# Patient Record
Sex: Male | Born: 1970 | Race: White | Hispanic: Yes | Marital: Married | Smoking: Never smoker
Health system: Southern US, Community
[De-identification: ages and names within clinical notes are randomized; demographics above are authoritative.]

## PROBLEM LIST (undated history)

## (undated) DIAGNOSIS — E079 Disorder of thyroid, unspecified: Secondary | ICD-10-CM

## (undated) DIAGNOSIS — K219 Gastro-esophageal reflux disease without esophagitis: Secondary | ICD-10-CM

## (undated) DIAGNOSIS — E785 Hyperlipidemia, unspecified: Secondary | ICD-10-CM

## (undated) HISTORY — DX: Hyperlipidemia, unspecified: E78.5

## (undated) HISTORY — DX: Disorder of thyroid, unspecified: E07.9

## (undated) HISTORY — DX: Gastro-esophageal reflux disease without esophagitis: K21.9

---

## 2004-06-14 ENCOUNTER — Ambulatory Visit: Payer: Self-pay | Admitting: Internal Medicine

## 2004-06-21 ENCOUNTER — Ambulatory Visit: Payer: Self-pay | Admitting: Internal Medicine

## 2005-06-22 ENCOUNTER — Ambulatory Visit: Payer: Self-pay | Admitting: Internal Medicine

## 2005-06-28 ENCOUNTER — Ambulatory Visit: Payer: Self-pay | Admitting: Internal Medicine

## 2006-01-26 ENCOUNTER — Ambulatory Visit: Payer: Self-pay | Admitting: Internal Medicine

## 2006-02-28 ENCOUNTER — Ambulatory Visit: Payer: Self-pay | Admitting: Internal Medicine

## 2006-03-03 ENCOUNTER — Ambulatory Visit (HOSPITAL_COMMUNITY): Admission: RE | Admit: 2006-03-03 | Discharge: 2006-03-03 | Payer: Self-pay | Admitting: Internal Medicine

## 2007-05-02 ENCOUNTER — Encounter: Payer: Self-pay | Admitting: *Deleted

## 2007-05-02 DIAGNOSIS — M722 Plantar fascial fibromatosis: Secondary | ICD-10-CM

## 2016-10-19 ENCOUNTER — Other Ambulatory Visit: Payer: Self-pay | Admitting: Family Medicine

## 2016-10-19 ENCOUNTER — Ambulatory Visit
Admission: RE | Admit: 2016-10-19 | Discharge: 2016-10-19 | Disposition: A | Discharge status: Home / Self Care | Payer: BLUE CROSS/BLUE SHIELD | Source: Ambulatory Visit | Attending: Family Medicine | Admitting: Family Medicine

## 2016-10-19 DIAGNOSIS — R053 Chronic cough: Secondary | ICD-10-CM

## 2016-10-19 DIAGNOSIS — R05 Cough: Secondary | ICD-10-CM

## 2020-07-15 ENCOUNTER — Other Ambulatory Visit: Payer: Self-pay

## 2020-07-15 ENCOUNTER — Ambulatory Visit
Admission: RE | Admit: 2020-07-15 | Discharge: 2020-07-15 | Disposition: A | Discharge status: Home / Self Care | Payer: BLUE CROSS/BLUE SHIELD | Source: Ambulatory Visit | Attending: Family Medicine | Admitting: Family Medicine

## 2020-07-15 ENCOUNTER — Other Ambulatory Visit: Payer: Self-pay | Admitting: Family Medicine

## 2020-07-15 DIAGNOSIS — R52 Pain, unspecified: Secondary | ICD-10-CM

## 2021-05-21 ENCOUNTER — Encounter: Payer: Self-pay | Admitting: Gastroenterology

## 2021-07-07 ENCOUNTER — Ambulatory Visit (AMBULATORY_SURGERY_CENTER): Payer: BLUE CROSS/BLUE SHIELD | Admitting: *Deleted

## 2021-07-07 ENCOUNTER — Other Ambulatory Visit: Payer: Self-pay

## 2021-07-07 VITALS — Ht 70.0 in | Wt 170.0 lb

## 2021-07-07 DIAGNOSIS — Z1211 Encounter for screening for malignant neoplasm of colon: Secondary | ICD-10-CM

## 2021-07-07 MED ORDER — SUTAB 1479-225-188 MG PO TABS: 1.0000 | ORAL_TABLET | ORAL | 0 refills | Status: DC

## 2021-07-07 NOTE — Progress Notes (Signed)
Patient's pre-visit was done today over the phone with the patient and wife. Name,DOB and address verified. Patient denies any allergies to Eggs and Soy. Patient denies any problems with anesthesia/sedation. Patient is not taking any diet pills or blood thinners. No home Oxygen. Prep instructions sent to pt's email-pt is aware. Patient understands to call us back with any questions or concerns. Patient is aware of our care-partner policy and Covid-19 safety protocol.    The patient is COVID-19 vaccinated.

## 2021-07-08 ENCOUNTER — Encounter: Payer: Self-pay | Admitting: Gastroenterology

## 2021-07-14 ENCOUNTER — Ambulatory Visit (AMBULATORY_SURGERY_CENTER): Payer: BC Managed Care – PPO | Admitting: Gastroenterology

## 2021-07-14 ENCOUNTER — Other Ambulatory Visit: Payer: Self-pay

## 2021-07-14 ENCOUNTER — Encounter: Payer: Self-pay | Admitting: Gastroenterology

## 2021-07-14 VITALS — BP 103/55 | HR 69 | Temp 97.7°F | Resp 12 | Ht 70.0 in | Wt 170.0 lb

## 2021-07-14 DIAGNOSIS — Z1211 Encounter for screening for malignant neoplasm of colon: Secondary | ICD-10-CM | POA: Diagnosis not present

## 2021-07-14 MED ORDER — SODIUM CHLORIDE 0.9 % IV SOLN: 500.0000 mL | Freq: Once | INTRAVENOUS | Status: DC

## 2021-07-14 NOTE — Op Note (Signed)
[facility] Patient Name: Brandon Lynch Procedure Date: 07/14/2021 1:38 PM MRN: [id number] Endoscopist: Viviann Spare P. Adela Lank , MD Age: 50 Referring MD:  Date of Birth: 1970/11/28 Gender: Male Account #: [id number] Procedure:                Colonoscopy Indications:              Screening for colorectal malignant neoplasm, This                            is the patient's first colonoscopy Medicines:                Monitored Anesthesia Care Procedure:                Pre-Anesthesia Assessment:                           - Prior to the procedure, a History and Physical                            was performed, and patient medications and                            allergies were reviewed. The patient's tolerance of                            previous anesthesia was also reviewed. The risks                            and benefits of the procedure and the sedation                            options and risks were discussed with the patient.                            All questions were answered, and informed consent                            was obtained. Prior Anticoagulants: The patient has                            taken no previous anticoagulant or antiplatelet                            agents. ASA Grade Assessment: II - A patient with                            mild systemic disease. After reviewing the risks                            and benefits, the patient was deemed in                            satisfactory condition to undergo the procedure.  After obtaining informed consent, the colonoscope                            was passed under direct vision. Throughout the                            procedure, the patient's blood pressure, pulse, and                            oxygen saturations were monitored continuously. The                            Olympus Colonoscope #[id number] was introduced through                            the anus and  advanced to the the cecum, identified                            by appendiceal orifice and ileocecal valve. The                            colonoscopy was performed without difficulty. The                            patient tolerated the procedure well. The quality                            of the bowel preparation was good. The ileocecal                            valve, appendiceal orifice, and rectum were                            photographed. Scope In: 1:42:29 PM Scope Out: 2:02:16 PM Scope Withdrawal Time: 0 hours 17 minutes 10 seconds  Total Procedure Duration: 0 hours 19 minutes 47 seconds  Findings:                 The perianal and digital rectal examinations were                            normal.                           Internal hemorrhoids were found during                            retroflexion. The hemorrhoids were small.                           The exam was otherwise without abnormality. Complications:            No immediate complications. Estimated blood loss:                            None. Estimated Blood Loss:  Estimated blood loss: none. Impression:               - Internal hemorrhoids.                           - The examination was otherwise normal.                           - No polyps. Recommendation:           - Patient has a contact number available for                            emergencies. The signs and symptoms of potential                            delayed complications were discussed with the                            patient. Return to normal activities tomorrow.                            Written discharge instructions were provided to the                            patient.                           - Resume previous diet.                           - Continue present medications.                           - Repeat colonoscopy in 10 years for screening                            purposes. Viviann Spare P. Adela Lank, MD 07/14/2021 2:05:32  PM This report has been signed electronically.

## 2021-07-14 NOTE — Patient Instructions (Signed)
Handouts on hemorrhoids given. YOU HAD AN ENDOSCOPIC PROCEDURE TODAY AT THE [facility]:   Refer to the procedure report that was given to you for any specific questions about what was found during the examination.  If the procedure report does not answer your questions, please call your gastroenterologist to clarify.  If you requested that your care partner not be given the details of your procedure findings, then the procedure report has been included in a sealed envelope for you to review at your convenience later.  YOU SHOULD EXPECT: Some feelings of bloating in the abdomen. Passage of more gas than usual.  Walking can help get rid of the air that was put into your GI tract during the procedure and reduce the bloating. If you had a lower endoscopy (such as a colonoscopy or flexible sigmoidoscopy) you may notice spotting of blood in your stool or on the toilet paper. If you underwent a bowel prep for your procedure, you may not have a normal bowel movement for a few days.  Please Note:  You might notice some irritation and congestion in your nose or some drainage.  This is from the oxygen used during your procedure.  There is no need for concern and it should clear up in a day or so.  SYMPTOMS TO REPORT IMMEDIATELY:  Following lower endoscopy (colonoscopy or flexible sigmoidoscopy):  Excessive amounts of blood in the stool  Significant tenderness or worsening of abdominal pains  Swelling of the abdomen that is new, acute  Fever of 100F or higher   For urgent or emergent issues, a gastroenterologist can be reached at any hour by calling [phone]. Do not use MyChart messaging for urgent concerns.    DIET:  We do recommend a small meal at first, but then you may proceed to your regular diet.  Drink plenty of fluids but you should avoid alcoholic beverages for 24 hours.  ACTIVITY:  You should plan to take it easy for the rest of today and you should NOT DRIVE or use heavy  machinery until tomorrow (because of the sedation medicines used during the test).    FOLLOW UP: Our staff will call the number listed on your records 48-72 hours following your procedure to check on you and address any questions or concerns that you may have regarding the information given to you following your procedure. If we do not reach you, we will leave a message.  We will attempt to reach you two times.  During this call, we will ask if you have developed any symptoms of COVID 19. If you develop any symptoms (ie: fever, flu-like symptoms, shortness of breath, cough etc.) before then, please call [phone].  If you test positive for Covid 19 in the 2 weeks post procedure, please call and report this information to [place].    If any biopsies were taken you will be contacted by phone or by letter within the next 1-3 weeks.  Please call us at [phone] if you have not heard about the biopsies in 3 weeks.    SIGNATURES/CONFIDENTIALITY: You and/or your care partner have signed paperwork which will be entered into your electronic medical record.  These signatures attest to the fact that that the information above on your After Visit Summary has been reviewed and is understood.  Full responsibility of the confidentiality of this discharge information lies with you and/or your care-partner.

## 2021-07-14 NOTE — Progress Notes (Signed)
VS by CW  Pt's states no medical or surgical changes since previsit or office visit.  

## 2021-07-14 NOTE — Progress Notes (Signed)
To PACU, VSS. Reportj to [email or web address]

## 2021-07-14 NOTE — Progress Notes (Signed)
[facility] History and Physical   Primary Care Physician:  Mosetta Putt, MD   Reason for Procedure:   Colon cancer screening  Plan:    colonoscopy     HPI: Brandon Lynch is a 50 y.o. male  here for colonoscopy screening - first time exam. Patient denies any bowel symptoms at this time. No family history of colon cancer known. Otherwise feels well without any cardiopulmonary symptoms.    Past Medical History:  Diagnosis Date   GERD (gastroesophageal reflux disease)    Hyperlipidemia    Thyroid disease     Past Surgical History:  Procedure Laterality Date   CATARACT EXTRACTION, BILATERAL     RETINAL TEAR REPAIR CRYOTHERAPY Bilateral    x4   VASECTOMY      Prior to Admission medications   Medication Sig Start Date End Date Taking? Authorizing Provider  Cholecalciferol 50 MCG (2000 UT) CAPS Take by mouth.   Yes [provider]  famotidine (PEPCID) 20 MG tablet Take 20 mg by mouth daily. 03/24/21  Yes [provider]  Multiple Vitamin (MULTI-VITAMIN) tablet Take 1 tablet by mouth daily.   Yes [provider]  rosuvastatin (CRESTOR) 10 MG tablet Take 10 mg by mouth at bedtime. 06/16/21  Yes [provider]  SYNTHROID 88 MCG tablet Take 88 mcg by mouth daily. 06/09/21  Yes [provider]    Current Outpatient Medications  Medication Sig Dispense Refill   Cholecalciferol 50 MCG (2000 UT) CAPS Take by mouth.     famotidine (PEPCID) 20 MG tablet Take 20 mg by mouth daily.     Multiple Vitamin (MULTI-VITAMIN) tablet Take 1 tablet by mouth daily.     rosuvastatin (CRESTOR) 10 MG tablet Take 10 mg by mouth at bedtime.     SYNTHROID 88 MCG tablet Take 88 mcg by mouth daily.     Current Facility-Administered Medications  Medication Dose Route Frequency Provider Last Rate Last Admin   0.9 %  sodium chloride infusion  500 mL Intravenous Once Marylynn Rigdon, Willaim Rayas, MD        Allergies as of 07/14/2021   (No Known  Allergies)    Family History  Problem Relation Age of Onset   Colon cancer Neg Hx     Social History   Socioeconomic History   Marital status: Married    Spouse name: Not on file   Number of children: Not on file   Years of education: Not on file   Highest education level: Not on file  Occupational History   Not on file  Tobacco Use   Smoking status: Never   Smokeless tobacco: Never  Vaping Use   Vaping Use: Never used  Substance and Sexual Activity   Alcohol use: Yes    Alcohol/week: 17.0 standard drinks    Types: 17 Glasses of wine per week   Drug use: Not Currently   Sexual activity: Not on file  Other Topics Concern   Not on file  Social History Narrative   Not on file   Social Determinants of Health   Financial Resource Strain: Not on file  Food Insecurity: Not on file  Transportation Needs: Not on file  Physical Activity: Not on file  Stress: Not on file  Social Connections: Not on file  Intimate Partner Violence: Not on file    Review of Systems: All other review of systems negative except as mentioned in the HPI.  Physical Exam: Vital signs BP 125/68    Pulse  88    Temp 97.7 F (36.5 C) (Skin)    Ht 5\' 10"  (1.778 m)    Wt 170 lb (77.1 kg)    SpO2 97%    BMI 24.39 kg/m   General:   Alert,  Well-developed, pleasant and cooperative in NAD Lungs:  Clear throughout to auscultation.   Heart:  Regular rate and rhythm Abdomen:  Soft, nontender and nondistended.   Neuro/Psych:  Alert and cooperative. Normal mood and affect. A and O x 3  , MD [facility]

## 2021-07-16 ENCOUNTER — Telehealth: Payer: Self-pay | Admitting: *Deleted

## 2021-07-16 NOTE — Telephone Encounter (Signed)
°  Follow up Call-  Call back number 07/14/2021  Post procedure Call Back phone  # [phone]  Permission to leave phone message Yes  Some recent data might be hidden   [facility]

## 2021-07-16 NOTE — Telephone Encounter (Signed)
°  Follow up Call-  Call back number 07/14/2021  Post procedure Call Back phone  # [phone]  Permission to leave phone message Yes  Some recent data might be hidden     Patient questions:  Do you have a fever, pain , or abdominal swelling? No. Pain Score  0 *  Have you tolerated food without any problems? Yes.    Have you been able to return to your normal activities? Yes.    Do you have any questions about your discharge instructions: Diet   No. Medications  No. Follow up visit  No.  Do you have questions or concerns about your Care? No.  Actions: * If pain score is 4 or above: No action needed, pain <4.  Have you developed a fever since your procedure? no  2.   Have you had an respiratory symptoms (SOB or cough) since your procedure? no  3.   Have you tested positive for COVID 19 since your procedure no  4.   Have you had any family members/close contacts diagnosed with the COVID 19 since your procedure?  no   If yes to any of these questions please route to Laverna Peace, RN and Karlton Lemon, RN

## 2021-09-04 IMAGING — CR DG WRIST COMPLETE 3+V*R*
4 series · 4 of 4 positions shown · non-contrast
Comparison: None.

CLINICAL DATA: Pain

EXAM:
RIGHT WRIST - COMPLETE 3+ VIEW

[x wrist pa right]
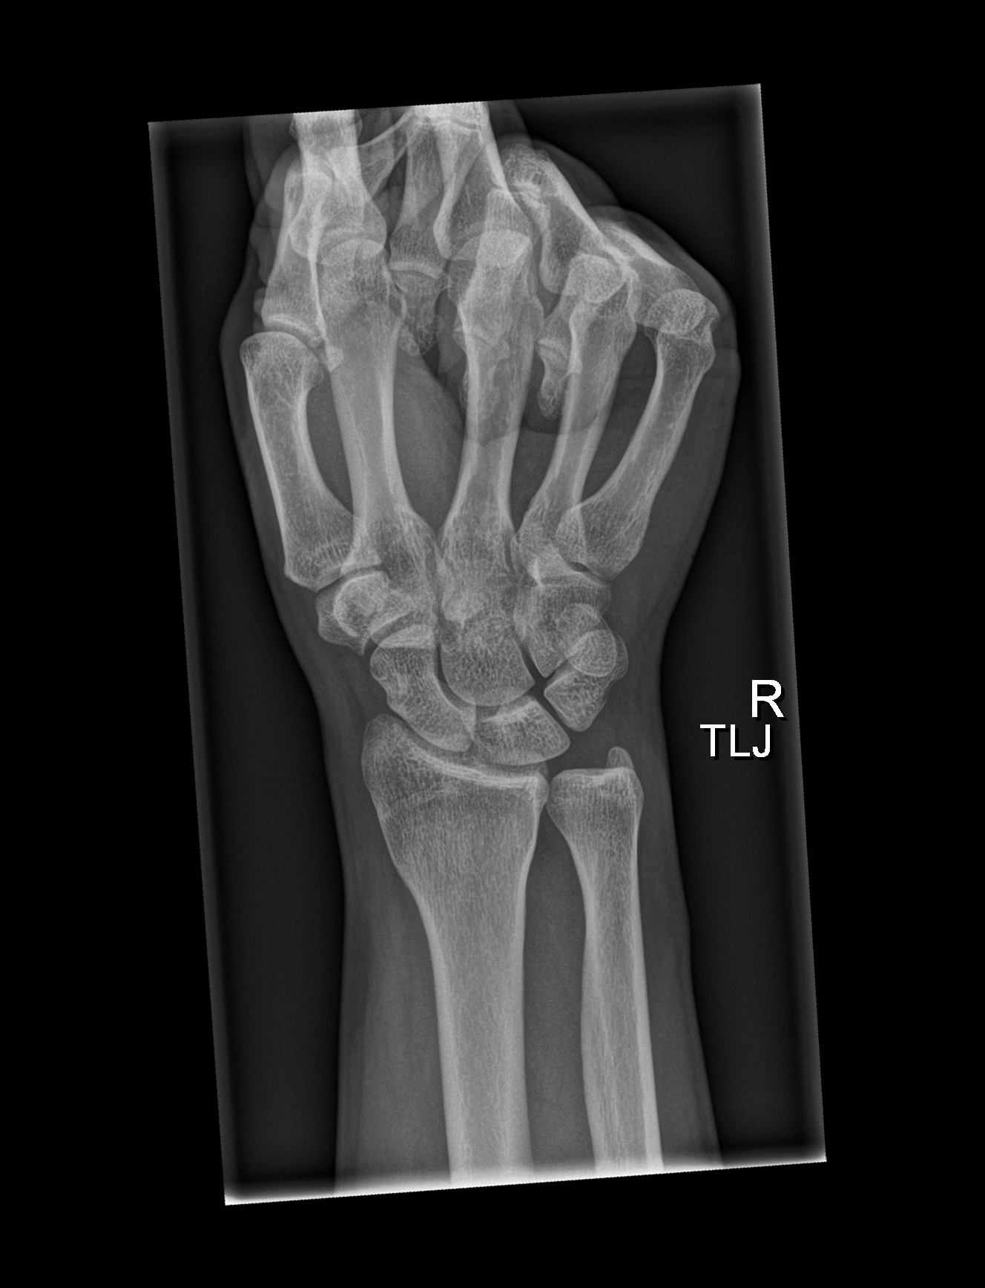

[x wrist obl right]
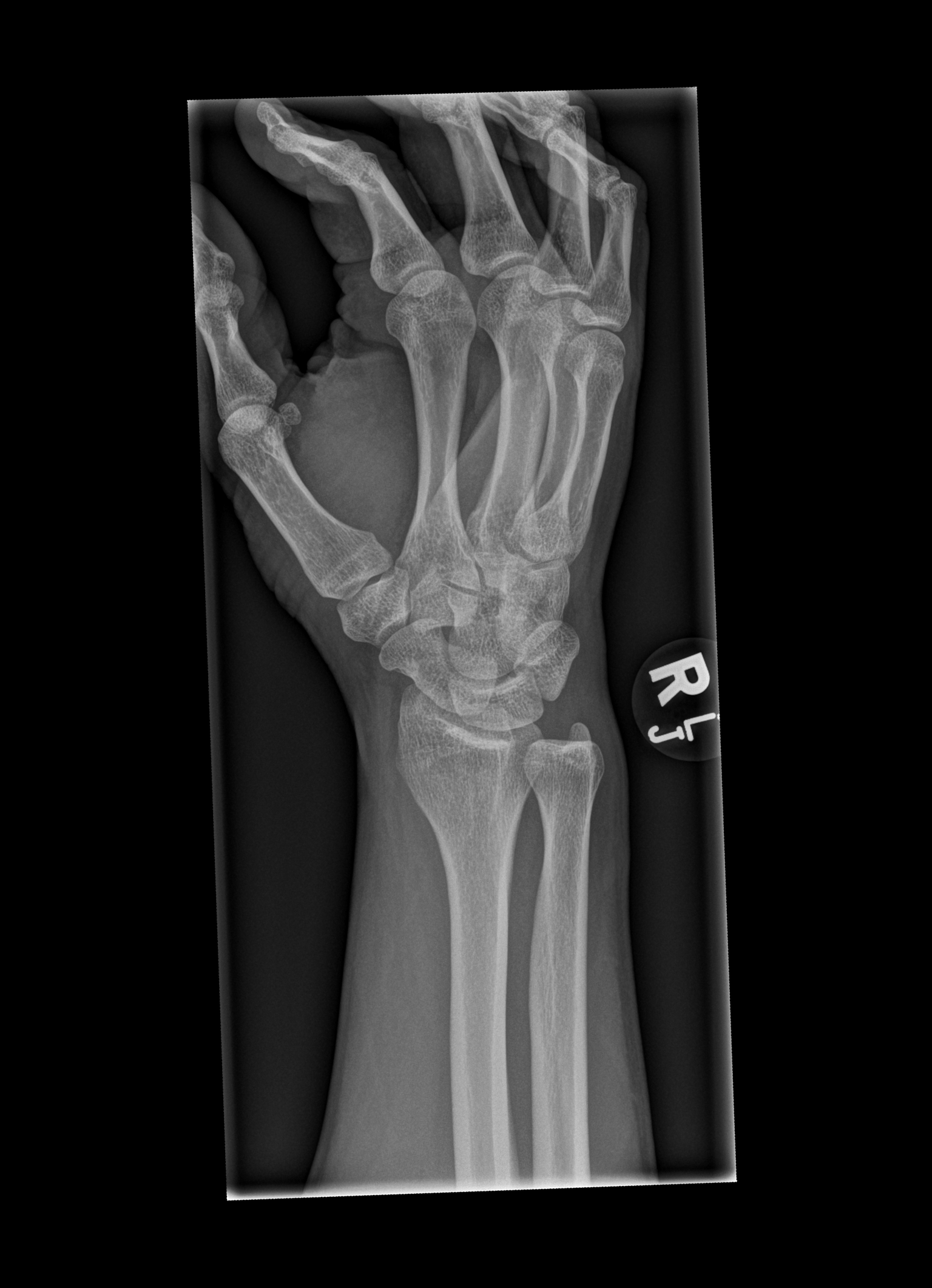

[x wrist lat right (1 of 2)]
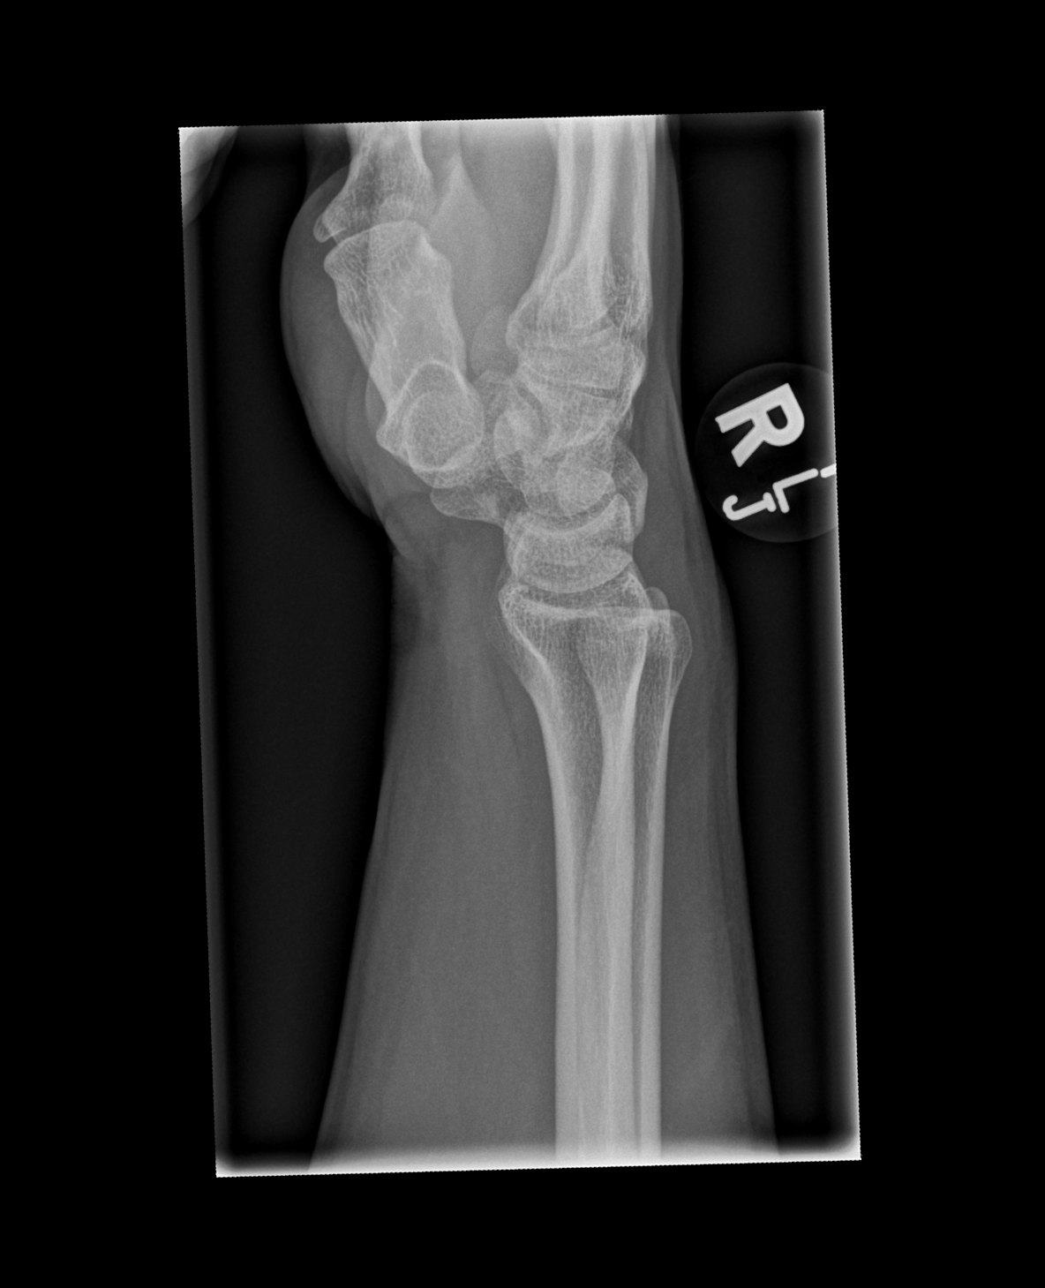

[x wrist lat right (2 of 2)]
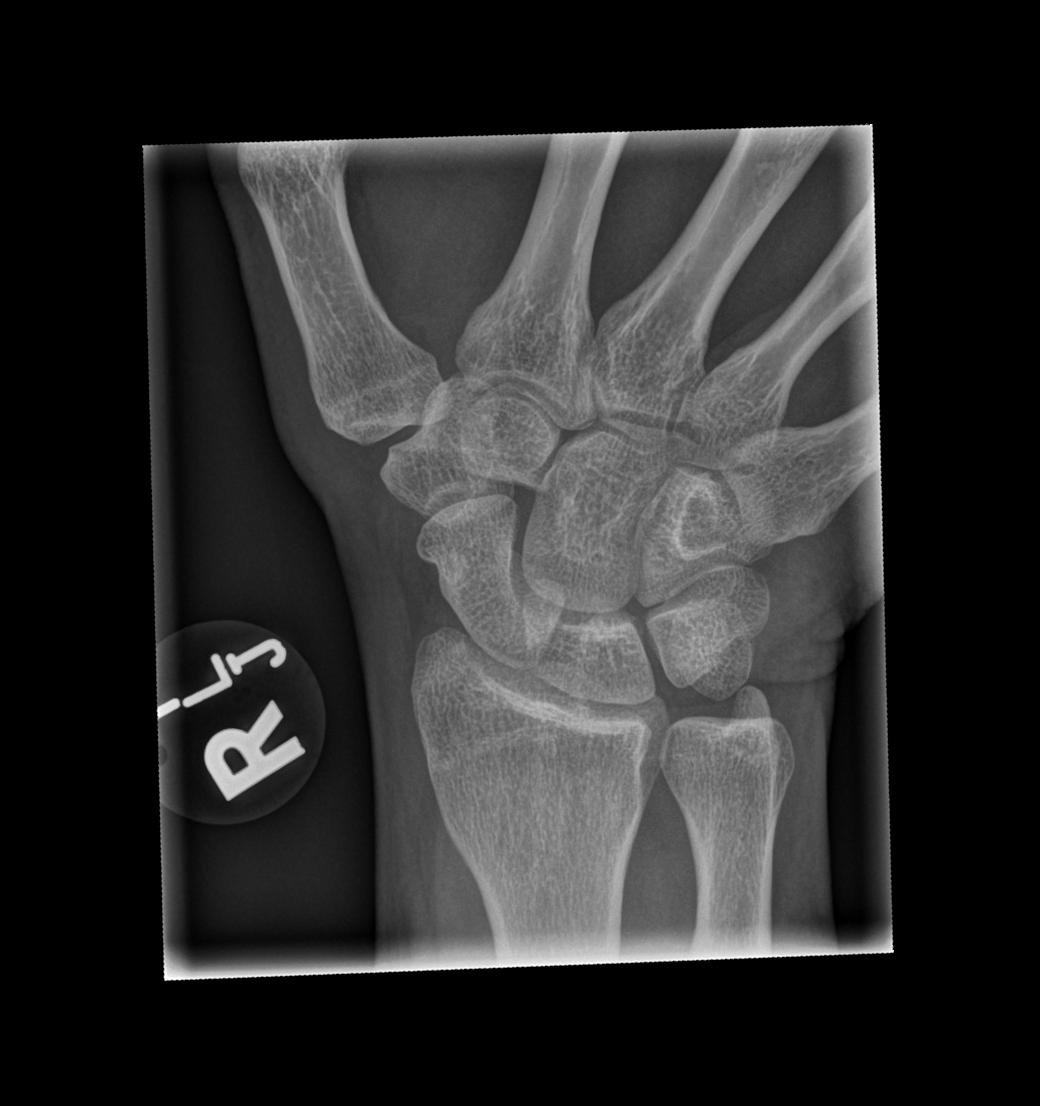

[4 of 4 positions shown; findings below may reference images not displayed]

FINDINGS: There is no evidence of fracture or dislocation. There is no
evidence of arthropathy or other focal bone abnormality. Soft
tissues are unremarkable.
IMPRESSION: Negative.
